# Patient Record
Sex: Male | Born: 1990 | Race: Black or African American | Hispanic: No | Marital: Single
Health system: Southern US, Community
[De-identification: ages and names within clinical notes are randomized; demographics above are authoritative.]

---

## 2017-04-06 ENCOUNTER — Encounter (HOSPITAL_COMMUNITY): Payer: Self-pay | Admitting: Emergency Medicine

## 2017-04-06 ENCOUNTER — Emergency Department (HOSPITAL_COMMUNITY)
Admission: EM | Admit: 2017-04-06 | Discharge: 2017-04-06 | Disposition: A | Discharge status: Home / Self Care | Payer: Self-pay | Attending: Emergency Medicine | Admitting: Emergency Medicine

## 2017-04-06 ENCOUNTER — Emergency Department (HOSPITAL_COMMUNITY): Payer: Self-pay

## 2017-04-06 DIAGNOSIS — M25562 Pain in left knee: Secondary | ICD-10-CM | POA: Insufficient documentation

## 2017-04-06 MED ORDER — IBUPROFEN 800 MG PO TABS: 800.0000 mg | ORAL_TABLET | Freq: Three times a day (TID) | ORAL | 0 refills | Status: AC

## 2017-04-06 MED ORDER — IBUPROFEN 800 MG PO TABS
800.0000 mg | ORAL_TABLET | Freq: Once | ORAL | Status: AC
  Administered 2017-04-06: 800 mg via ORAL
  Filled 2017-04-06: qty 1

## 2017-04-06 NOTE — ED Provider Notes (Signed)
[facility]-EMERGENCY DEPT Provider Note   CSN: [id number] Arrival date & time: 04/06/17  0403     History   Chief Complaint Chief Complaint  Patient presents with  . Knee Injury    HPI Kristopher Briggs is a 26 y.o. male.  Patient presents to the emergency department with chief complaint of left knee pain. He states that he was dancing club tonight and twisted his knee. He felt like it dislocated. He reports pain with ambulation, and reports that he had to ambulate with assistance. He has not taken anything for her symptoms. He denies any numbness, weakness, or tingling. His symptoms are worsened with movement.   The history is provided by the patient. No language interpreter was used.    History reviewed. No pertinent past medical history.  There are no active problems to display for this patient.   No past surgical history on file.     Home Medications    Prior to Admission medications   Not on File    Family History No family history on file.  Social History Social History  Substance Use Topics  . Smoking status: Not on file  . Smokeless tobacco: Not on file  . Alcohol use Not on file     Allergies   Patient has no known allergies.   Review of Systems Review of Systems  All other systems reviewed and are negative.    Physical Exam Updated Vital Signs BP (!) 176/98   Pulse 97   Temp 98 F (36.7 C) (Oral)   Resp 18   SpO2 100%   Physical Exam Nursing note and vitals reviewed.  Constitutional: Pt appears well-developed and well-nourished. No distress.  HENT:  Head: Normocephalic and atraumatic.  Eyes: Conjunctivae are normal.  Neck: Normal range of motion.  Cardiovascular: Normal rate, regular rhythm. Intact distal pulses.   Capillary refill < 3 sec.  Pulmonary/Chest: Effort normal and breath sounds normal.  Musculoskeletal:  Left knee Pt exhibits no bony abnormality or deformity.   ROM: limited 2/2 pain  Strength: deferred    Neurological: Pt  is alert. Coordination normal.  Sensation: 5/5 Skin: Skin is warm and dry. Pt is not diaphoretic.  No evidence of open wound or skin tenting Psychiatric: Pt has a normal mood and affect.     ED Treatments / Results  Labs (all labs ordered are listed, but only abnormal results are displayed) Labs Reviewed - No data to display  EKG  EKG Interpretation None       Radiology Dg Knee Complete 4 Views Left  Result Date: 04/06/2017 CLINICAL DATA:  Twisting injury to the left knee tonight. Felt a pop. Left knee pain. EXAM: LEFT KNEE - COMPLETE 4+ VIEW COMPARISON:  None. FINDINGS: No evidence of fracture, dislocation, or joint effusion. No evidence of arthropathy or other focal bone abnormality. Soft tissues are unremarkable. IMPRESSION: Negative. Electronically Signed   By: Burman Nieves M.D.   On: 04/06/2017 05:11    Procedures Procedures (including critical care time)  Medications Ordered in ED Medications - No data to display   Initial Impression / Assessment and Plan / ED Course  I have reviewed the triage vital signs and the nursing notes.  Pertinent labs & imaging results that were available during my care of the patient were reviewed by me and considered in my medical decision making (see chart for details).     Patient X-Ray negative for obvious fracture or dislocation.  Pt advised to follow up with  orthopedics. Patient given knee immobilizer and crutches while in ED, conservative therapy recommended and discussed. Patient will be discharged home & is agreeable with above plan. Returns precautions discussed. Pt appears safe for discharge.   Final Clinical Impressions(s) / ED Diagnoses   Final diagnoses:  Acute pain of left knee    New Prescriptions New Prescriptions   IBUPROFEN (ADVIL,MOTRIN) 800 MG TABLET    Take 1 tablet (800 mg total) by mouth 3 (three) times daily.     Roxy Horseman, PA-C 04/06/17 [id number]    Geoffery Lyons,  MD 04/06/17 [phone]

## 2017-04-06 NOTE — ED Triage Notes (Signed)
Pt states swelling and unable to bare weight to left knee after dancing on it at the club.

## 2017-04-06 NOTE — ED Notes (Signed)
Ortho called for knee immobilizer and crutches.  

## 2017-05-02 ENCOUNTER — Ambulatory Visit (INDEPENDENT_AMBULATORY_CARE_PROVIDER_SITE_OTHER): Payer: Self-pay | Admitting: Orthopedic Surgery

## 2017-05-06 ENCOUNTER — Ambulatory Visit (INDEPENDENT_AMBULATORY_CARE_PROVIDER_SITE_OTHER): Payer: Self-pay | Admitting: Orthopedic Surgery

## 2017-05-06 ENCOUNTER — Encounter (INDEPENDENT_AMBULATORY_CARE_PROVIDER_SITE_OTHER): Payer: Self-pay | Admitting: Orthopedic Surgery

## 2017-05-07 ENCOUNTER — Ambulatory Visit (INDEPENDENT_AMBULATORY_CARE_PROVIDER_SITE_OTHER): Payer: Self-pay | Admitting: Orthopedic Surgery

## 2017-05-07 ENCOUNTER — Encounter (INDEPENDENT_AMBULATORY_CARE_PROVIDER_SITE_OTHER): Payer: Self-pay | Admitting: Orthopedic Surgery

## 2017-05-07 ENCOUNTER — Ambulatory Visit (INDEPENDENT_AMBULATORY_CARE_PROVIDER_SITE_OTHER): Payer: Self-pay

## 2017-05-07 DIAGNOSIS — M25562 Pain in left knee: Secondary | ICD-10-CM

## 2017-05-07 DIAGNOSIS — G8929 Other chronic pain: Secondary | ICD-10-CM

## 2017-05-07 NOTE — Progress Notes (Signed)
   Office Visit Note   Patient: Kristopher Briggs           Date of Birth: 03/20/1991           MRN: [id number] Visit Date: 05/07/2017              Requested by: No referring provider defined for this encounter. PCP: Patient, No Pcp Per  Chief Complaint  Patient presents with  . Left Knee - Pain    Follow up twisit injury 04/06/17. To ER placed in Bledsoe  Set at 10 degrees       HPI: Patient is a [age] year old gentleman who states that he was doing [employer] when he was doing a clean and jerk and had immediate onset of left knee pain.  Patient eventually went to the emergency room radiographs were obtained which showed no fractures he was placed in a Bledsoe brace locked in extension and presents for initial evaluation.  Patient complains of numbness in the distribution of the superficial peroneal nerve.  He states it has been present since the immobilizer has been placed.  Assessment & Plan: Visit Diagnoses:  1. Chronic pain of left knee     Plan: Recommended discontinue the knee immobilizer recommended Aleve 2 p.o. twice daily with food recommended nonimpact  such as an elliptical machine or stationary bicycle.  Follow-Up Instructions: Return in about 2 weeks (around 05/21/2017).   Ortho Exam  Patient is alert, oriented, no adenopathy, well-dressed, normal affect, normal respiratory effort. Examination patient has an antalgic gait.  He only has range of motion from 10-30 degrees any further flexion is painful.  He has subjective numbness in the peroneal nerve distribution.  Patient has no tenderness to palpation around the fibular neck.  The knee has no effusion he has minimal tenderness to palpation of the patellofemoral joint the medial and lateral joint lines are minimally tender to palpation..  The patella is midline.  The medial joint line is minimally tender to palpation.  Collateral and cruciate ligaments are stable.  There are no abrasions no malalignment  Imaging: Xr  Knee 1-2 Views Left  Result Date: 05/07/2017 2 view radiographs of the left knee shows no bony abnormalities there is no fractures no joint space incongruency.  There is no effusion.  No images are attached to the encounter.  Labs: No results found for: HGBA1C, ESRSEDRATE, CRP, LABURIC, REPTSTATUS, GRAMSTAIN, CULT, LABORGA  Orders:  Orders Placed This Encounter  Procedures  . XR Knee 1-2 Views Left   No orders of the defined types were placed in this encounter.    Procedures: No procedures performed  Clinical Data: No additional findings.  ROS:  All other systems negative, except as noted in the HPI. Review of Systems  Objective: Vital Signs: There were no vitals taken for this visit.  Specialty Comments:  No specialty comments available.  PMFS History: There are no active problems to display for this patient.  History reviewed. No pertinent past medical history.  History reviewed. No pertinent family history.  History reviewed. No pertinent surgical history. Social History   Occupational History  . Not on file  Tobacco Use  . Smoking status: Unknown If Ever Smoked  . Smokeless tobacco: Never Used  Substance and Sexual Activity  . Alcohol use: Not on file  . Drug use: Not on file  . Sexual activity: Not on file

## 2017-05-21 ENCOUNTER — Ambulatory Visit (INDEPENDENT_AMBULATORY_CARE_PROVIDER_SITE_OTHER): Payer: Self-pay | Admitting: Orthopedic Surgery

## 2017-05-28 ENCOUNTER — Telehealth (INDEPENDENT_AMBULATORY_CARE_PROVIDER_SITE_OTHER): Payer: Self-pay | Admitting: Radiology

## 2017-05-28 ENCOUNTER — Other Ambulatory Visit (INDEPENDENT_AMBULATORY_CARE_PROVIDER_SITE_OTHER): Payer: Self-pay

## 2017-05-28 NOTE — Telephone Encounter (Signed)
I tried to call the pt and message states that "the party is not available please try again later" the pt was last seen 05/07/17 one follow up visit from ER was advised to follow up in 2 weeks. No appt sch. I will try again later. I can advise ok to return to work activity as tolerated by pt but will need a fax number to send the letter.

## 2017-05-28 NOTE — Telephone Encounter (Signed)
From our email-  Good morning, I was a patient with of Dr. Audrie LiaUDA's and I failed to get a form to return to work from my last visit on the 20th. I was wondering if there is any way I can get one faxed or emailed to me so I can return back to work ASAP. THANK Kristopher SouthwardYOU, Kristopher Briggs  Patient's email- [email or web address]

## 2017-05-28 NOTE — Telephone Encounter (Signed)
Pt called and advised that he would like letter to be faxed to [phone] this was completed and faxed as requested.

## 2018-08-02 IMAGING — CR DG KNEE COMPLETE 4+V*L*
4 series · 4 of 4 positions shown · non-contrast
Comparison: None.

CLINICAL DATA: Twisting injury to the left knee tonight. Felt a
pop. Left knee pain.

EXAM:
LEFT KNEE - COMPLETE 4+ VIEW

[knee ap]
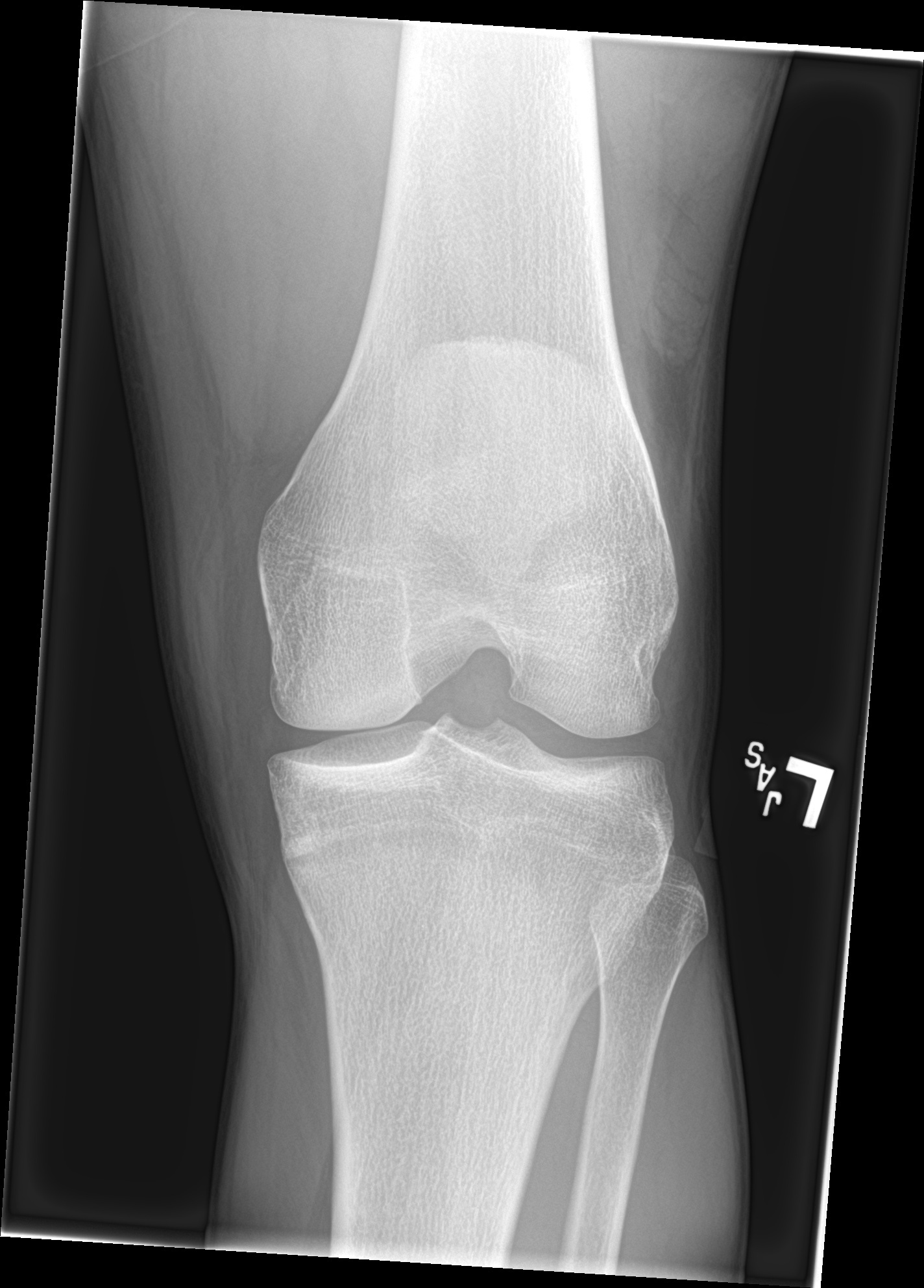

[knee lat]
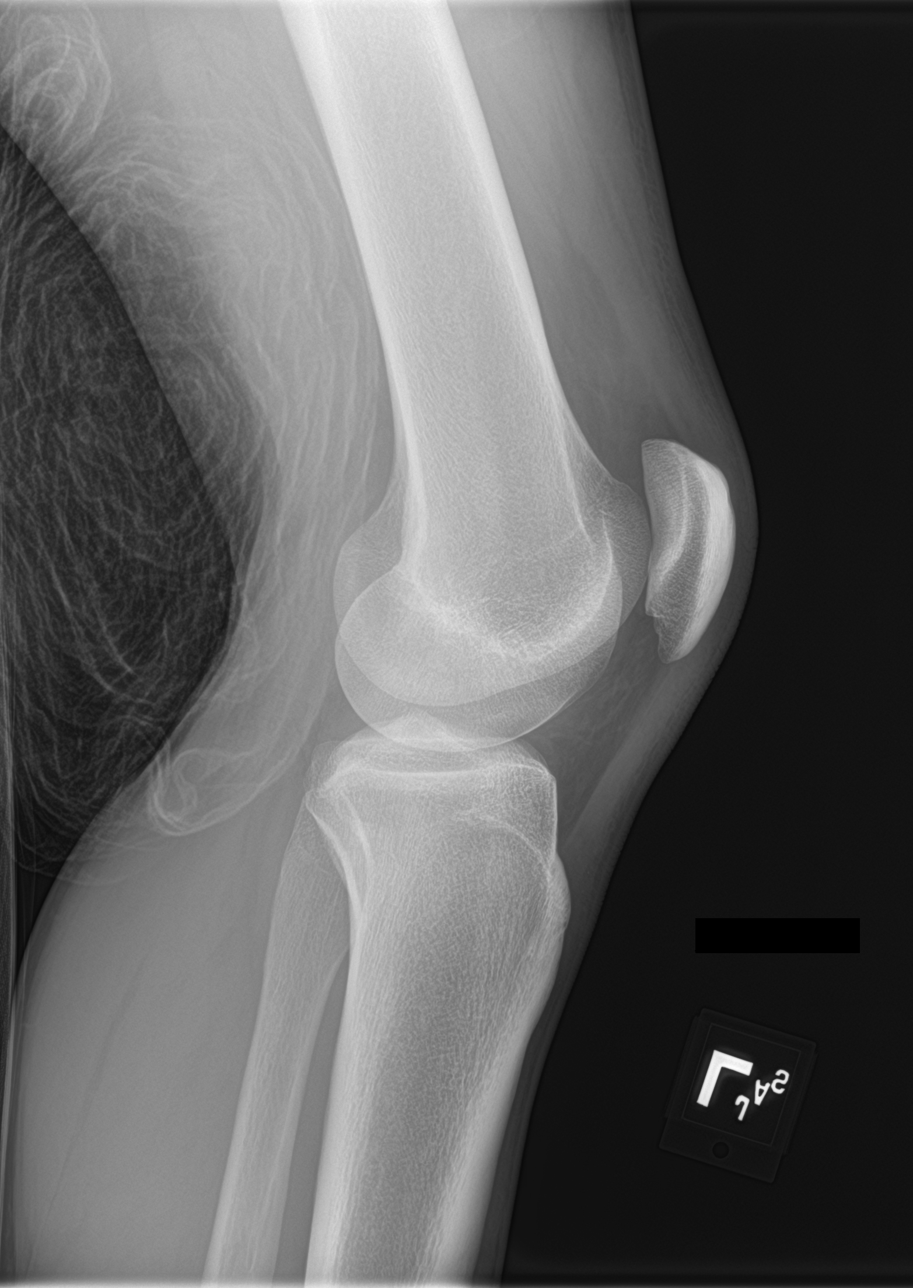

[knee obl (1 of 2)]
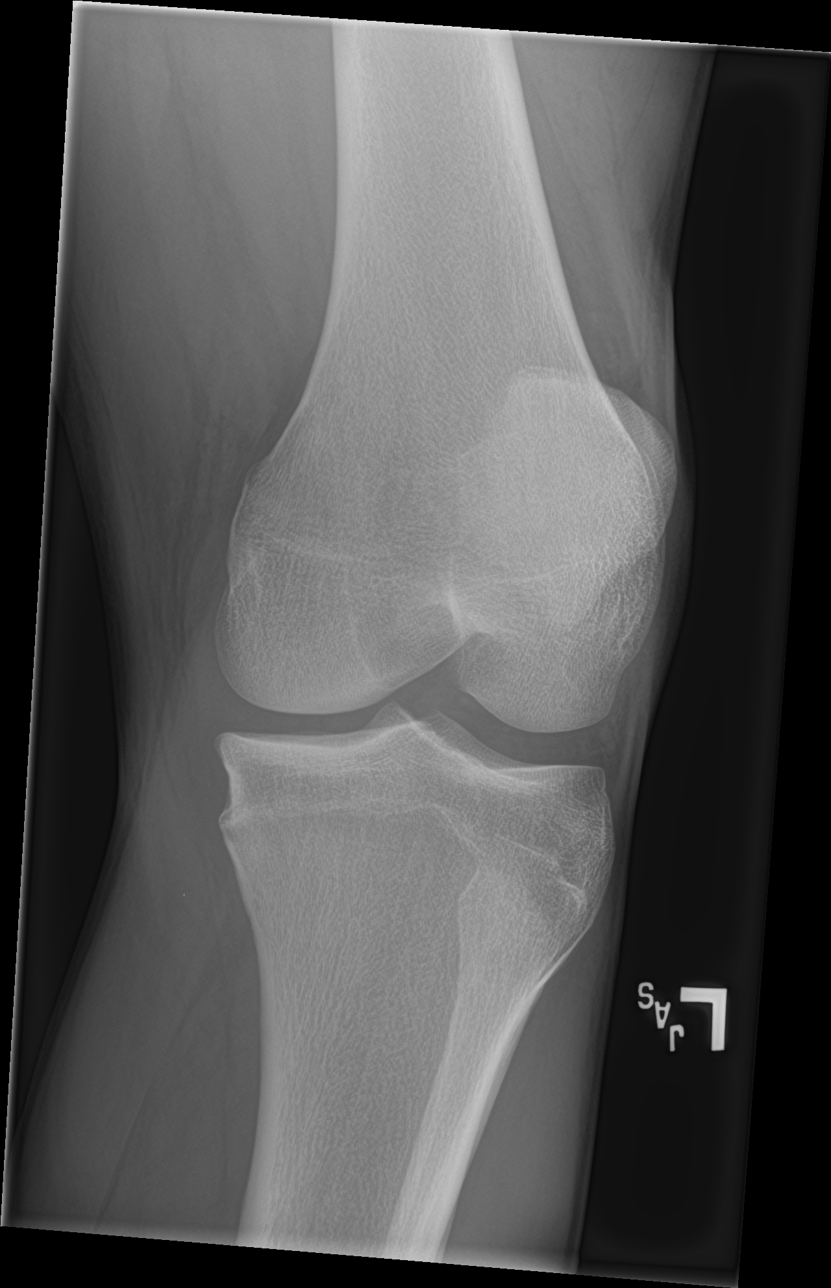

[knee obl (2 of 2)]
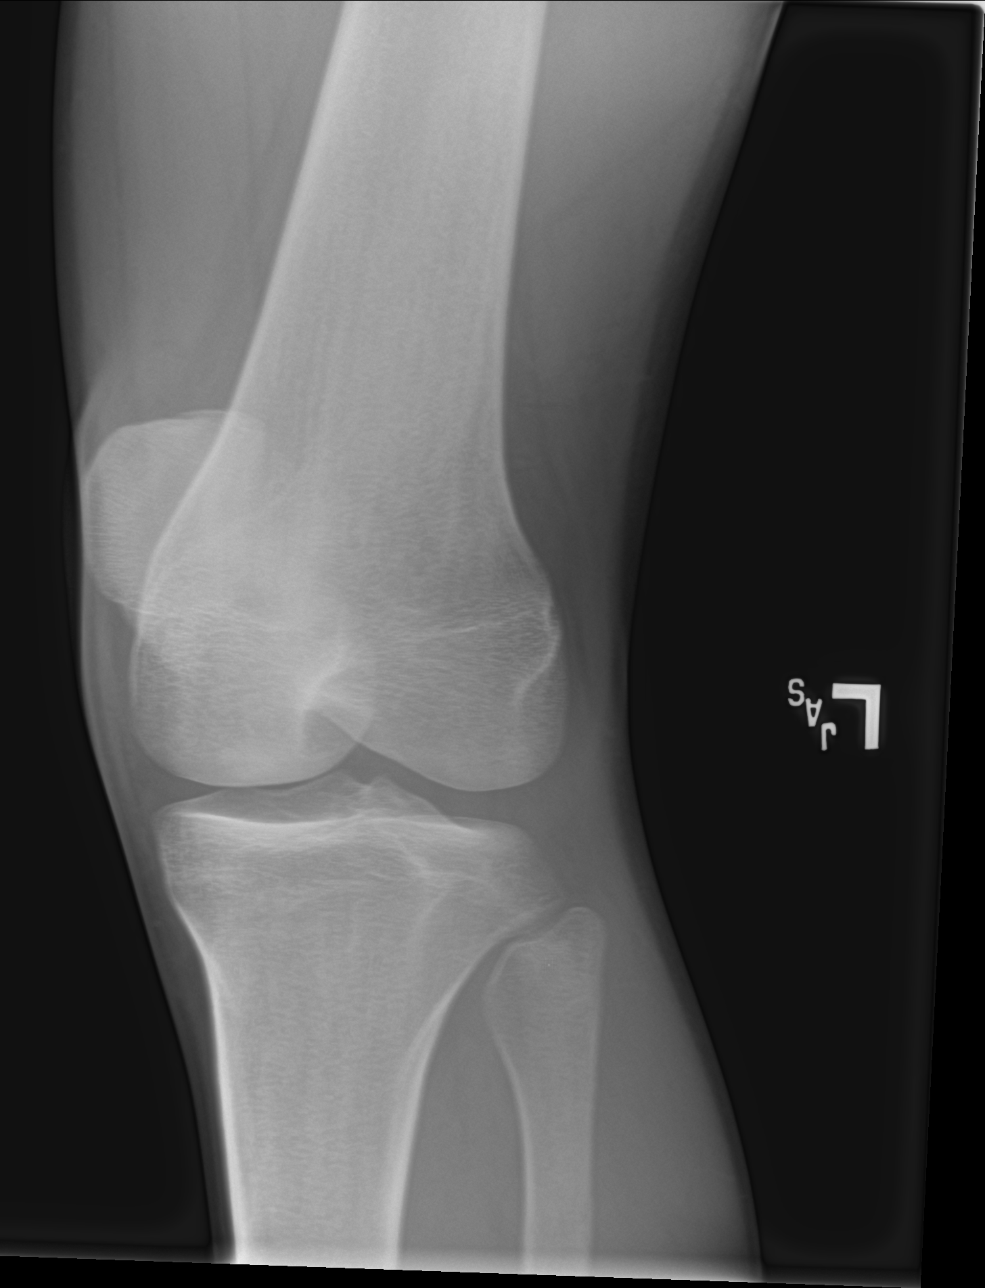

[4 of 4 positions shown; findings below may reference images not displayed]

FINDINGS: No evidence of fracture, dislocation, or joint effusion. No evidence
of arthropathy or other focal bone abnormality. Soft tissues are
unremarkable.
IMPRESSION: Negative.
